# Patient Record
Sex: Female | Born: 1950
Health system: Southern US, Community
[De-identification: ages and names within clinical notes are randomized; demographics above are authoritative.]

## PROBLEM LIST (undated history)

## (undated) DIAGNOSIS — Z974 Presence of external hearing-aid: Secondary | ICD-10-CM

## (undated) DIAGNOSIS — M1990 Unspecified osteoarthritis, unspecified site: Secondary | ICD-10-CM

## (undated) HISTORY — PX: CATARACT EXTRACTION W/ INTRAOCULAR LENS IMPLANT: SHX1309

## (undated) HISTORY — PX: EYE SURGERY: SHX253

## (undated) HISTORY — PX: COLONOSCOPY: SHX174

---

## 2003-09-18 DIAGNOSIS — Q100 Congenital ptosis: Secondary | ICD-10-CM | POA: Insufficient documentation

## 2006-09-06 ENCOUNTER — Emergency Department: Payer: Self-pay | Admitting: Emergency Medicine

## 2011-04-29 ENCOUNTER — Ambulatory Visit: Payer: Self-pay

## 2013-04-02 DIAGNOSIS — Z9889 Other specified postprocedural states: Secondary | ICD-10-CM | POA: Insufficient documentation

## 2013-04-02 DIAGNOSIS — Z124 Encounter for screening for malignant neoplasm of cervix: Secondary | ICD-10-CM | POA: Insufficient documentation

## 2014-03-19 HISTORY — PX: KNEE ARTHROSCOPY: SUR90

## 2015-10-10 DIAGNOSIS — D1721 Benign lipomatous neoplasm of skin and subcutaneous tissue of right arm: Secondary | ICD-10-CM | POA: Insufficient documentation

## 2015-10-10 DIAGNOSIS — M19071 Primary osteoarthritis, right ankle and foot: Secondary | ICD-10-CM | POA: Insufficient documentation

## 2015-12-05 DIAGNOSIS — L219 Seborrheic dermatitis, unspecified: Secondary | ICD-10-CM | POA: Insufficient documentation

## 2015-12-05 DIAGNOSIS — B0089 Other herpesviral infection: Secondary | ICD-10-CM | POA: Insufficient documentation

## 2015-12-05 DIAGNOSIS — B009 Herpesviral infection, unspecified: Secondary | ICD-10-CM | POA: Insufficient documentation

## 2016-04-23 DIAGNOSIS — H903 Sensorineural hearing loss, bilateral: Secondary | ICD-10-CM | POA: Diagnosis not present

## 2016-05-16 DIAGNOSIS — Z1231 Encounter for screening mammogram for malignant neoplasm of breast: Secondary | ICD-10-CM | POA: Diagnosis not present

## 2016-06-07 DIAGNOSIS — Z87891 Personal history of nicotine dependence: Secondary | ICD-10-CM | POA: Diagnosis not present

## 2016-06-07 DIAGNOSIS — S61451A Open bite of right hand, initial encounter: Secondary | ICD-10-CM | POA: Diagnosis not present

## 2016-06-07 DIAGNOSIS — M19041 Primary osteoarthritis, right hand: Secondary | ICD-10-CM | POA: Diagnosis not present

## 2016-06-07 DIAGNOSIS — S61210A Laceration without foreign body of right index finger without damage to nail, initial encounter: Secondary | ICD-10-CM | POA: Diagnosis not present

## 2016-06-07 DIAGNOSIS — Z6833 Body mass index (BMI) 33.0-33.9, adult: Secondary | ICD-10-CM | POA: Diagnosis not present

## 2016-06-07 DIAGNOSIS — S61259A Open bite of unspecified finger without damage to nail, initial encounter: Secondary | ICD-10-CM | POA: Diagnosis not present

## 2016-06-07 DIAGNOSIS — S61250A Open bite of right index finger without damage to nail, initial encounter: Secondary | ICD-10-CM | POA: Diagnosis not present

## 2016-07-09 DIAGNOSIS — M25552 Pain in left hip: Secondary | ICD-10-CM | POA: Diagnosis not present

## 2016-07-09 DIAGNOSIS — S86111A Strain of other muscle(s) and tendon(s) of posterior muscle group at lower leg level, right leg, initial encounter: Secondary | ICD-10-CM | POA: Diagnosis not present

## 2016-07-13 DIAGNOSIS — Z7982 Long term (current) use of aspirin: Secondary | ICD-10-CM | POA: Diagnosis not present

## 2016-07-13 DIAGNOSIS — Z87891 Personal history of nicotine dependence: Secondary | ICD-10-CM | POA: Diagnosis not present

## 2016-07-13 DIAGNOSIS — Z1211 Encounter for screening for malignant neoplasm of colon: Secondary | ICD-10-CM | POA: Diagnosis not present

## 2016-07-13 DIAGNOSIS — D126 Benign neoplasm of colon, unspecified: Secondary | ICD-10-CM | POA: Diagnosis not present

## 2016-07-13 DIAGNOSIS — K5730 Diverticulosis of large intestine without perforation or abscess without bleeding: Secondary | ICD-10-CM | POA: Diagnosis not present

## 2016-07-13 DIAGNOSIS — E785 Hyperlipidemia, unspecified: Secondary | ICD-10-CM | POA: Diagnosis not present

## 2016-07-13 DIAGNOSIS — D122 Benign neoplasm of ascending colon: Secondary | ICD-10-CM | POA: Diagnosis not present

## 2016-09-03 DIAGNOSIS — H2512 Age-related nuclear cataract, left eye: Secondary | ICD-10-CM | POA: Diagnosis not present

## 2017-02-28 DIAGNOSIS — L309 Dermatitis, unspecified: Secondary | ICD-10-CM | POA: Diagnosis not present

## 2017-02-28 DIAGNOSIS — Z6833 Body mass index (BMI) 33.0-33.9, adult: Secondary | ICD-10-CM | POA: Diagnosis not present

## 2017-02-28 DIAGNOSIS — Z0000 Encounter for general adult medical examination without abnormal findings: Secondary | ICD-10-CM | POA: Diagnosis not present

## 2017-02-28 DIAGNOSIS — Z23 Encounter for immunization: Secondary | ICD-10-CM | POA: Diagnosis not present

## 2017-05-20 DIAGNOSIS — Z1231 Encounter for screening mammogram for malignant neoplasm of breast: Secondary | ICD-10-CM | POA: Diagnosis not present

## 2017-10-01 ENCOUNTER — Encounter: Payer: Self-pay | Admitting: Podiatry

## 2017-10-01 ENCOUNTER — Ambulatory Visit (INDEPENDENT_AMBULATORY_CARE_PROVIDER_SITE_OTHER): Payer: Medicare Other | Admitting: Podiatry

## 2017-10-01 ENCOUNTER — Ambulatory Visit (INDEPENDENT_AMBULATORY_CARE_PROVIDER_SITE_OTHER): Payer: Medicare Other

## 2017-10-01 VITALS — BP 144/79 | HR 54 | Resp 16

## 2017-10-01 DIAGNOSIS — M76821 Posterior tibial tendinitis, right leg: Secondary | ICD-10-CM

## 2017-10-01 DIAGNOSIS — M722 Plantar fascial fibromatosis: Secondary | ICD-10-CM

## 2017-10-01 NOTE — Patient Instructions (Signed)

## 2017-10-02 NOTE — Progress Notes (Signed)
  Subjective:  Patient ID: Heather Rodriguez, female    DOB: 07-02-1950,  MRN: [id number] HPI Chief Complaint  Patient presents with  . Ankle Pain    Medial and lateral ankle right - aching x 1 year, swelling, [place] ortho said had tendonitis over 1 year ago, been trying CBD oil and different shoes (some help)   . Foot Pain    Plantar heel left - aching x several years off and on, AM pain  . New Patient (Initial Visit)    67 y.o. female presents with the above complaint.   ROS: Denies fever chills nausea vomiting muscle aches pains calf pain back pain chest pain shortness of breath.  No past medical history on file.  No current outpatient medications on file.  No Known Allergies Review of Systems Objective:   Vitals:   10/01/17 1544  BP: (!) 144/79  Pulse: (!) 54  Resp: 16    General: Well developed, nourished, in no acute distress, alert and oriented x3   Dermatological: Skin is warm, dry and supple bilateral. Nails x 10 are well maintained; remaining integument appears unremarkable at this time. There are no open sores, no preulcerative lesions, no rash or signs of infection present.  Vascular: Dorsalis Pedis artery and Posterior Tibial artery pedal pulses are 2/4 bilateral with immedate capillary fill time. Pedal hair growth present. No varicosities and no lower extremity edema present bilateral.   Neruologic: Grossly intact via light touch bilateral. Vibratory intact via tuning fork bilateral. Protective threshold with Semmes Wienstein monofilament intact to all pedal sites bilateral. Patellar and Achilles deep tendon reflexes 2+ bilateral. No Babinski or clonus noted bilateral.   Musculoskeletal: No gross boney pedal deformities bilateral. No pain, crepitus, or limitation noted with foot and ankle range of motion bilateral. Muscular strength 5/5 in all groups tested bilateral.  She still has tenderness on palpation of the posterior tibial tendon insertion site of the navicular as  it courses beneath the medial malleolus.  There is some area of fluctuance consistent with synovitis or fluid within the tendon sheath itself.  The tendon is palpable and does not appear to be transected.  She also has some tenderness on palpation medial calcaneal tubercle of the left heel but not significant.  Gait: Unassisted, Nonantalgic.    Radiographs:  Radiographs taken today demonstrate moderate severe pes planus right no significant acute findings.  Soft tissue swelling around the ankle is noted.  Ankle joint space actually looks very good left foot demonstrates some soft tissue swelling of the plantar fascia at the calcaneal insertion site left.  Assessment & Plan:   Assessment: Pes planus with posterior tibial tendinitis right.  Cannot rule out a tear.  Plantar fasciitis left.  Plan: Discussed etiology pathology conservative or surgical therapies at this point injected to the area with 20 mg Kenalog 5 mg Marcaine just beneath and anterior to the medial malleolus right ankle.  Placed her in a Tri-Lock brace and tennis shoes and will follow-up with her in 1 month to reevaluate.  MRI may be necessary at that point to rule out any type of tear.     Rainna Nearhood T. [place], [place]

## 2017-10-29 ENCOUNTER — Ambulatory Visit: Payer: Medicare Other | Admitting: Podiatry

## 2017-10-31 ENCOUNTER — Encounter: Payer: Self-pay | Admitting: Podiatry

## 2017-10-31 ENCOUNTER — Ambulatory Visit (INDEPENDENT_AMBULATORY_CARE_PROVIDER_SITE_OTHER): Payer: Medicare Other | Admitting: Podiatry

## 2017-10-31 DIAGNOSIS — M76821 Posterior tibial tendinitis, right leg: Secondary | ICD-10-CM | POA: Diagnosis not present

## 2017-10-31 NOTE — Progress Notes (Signed)
She presents today for follow-up of her posterior tibial tendinitis right and plantar fasciitis left.  The left foot is doing great the right one is still giving me if it is hurting so bad I can hardly stand on it.  Objective: Vital signs are stable she is alert and oriented x3.  Pulses are palpable.  She still has considerable pain on palpation of the posterior tibial tendon with pain on inversion against resistance.  Upon palpation of the tendon there is an area of fluctuance just beneath the medial malleolus.  Assessment: Cannot rule out a tear of the posterior tibial tendon.  And plantar fasciitis.  Plan: At this point am requesting an MRI of her posterior tibial tendon tear for surgical evaluation.

## 2017-11-01 ENCOUNTER — Other Ambulatory Visit: Payer: Self-pay

## 2017-11-01 DIAGNOSIS — M76821 Posterior tibial tendinitis, right leg: Secondary | ICD-10-CM

## 2017-11-01 DIAGNOSIS — M722 Plantar fascial fibromatosis: Secondary | ICD-10-CM

## 2017-11-01 DIAGNOSIS — S96911A Strain of unspecified muscle and tendon at ankle and foot level, right foot, initial encounter: Secondary | ICD-10-CM

## 2017-12-24 DIAGNOSIS — Z23 Encounter for immunization: Secondary | ICD-10-CM | POA: Diagnosis not present

## 2018-01-06 DIAGNOSIS — L308 Other specified dermatitis: Secondary | ICD-10-CM | POA: Diagnosis not present

## 2018-03-31 ENCOUNTER — Telehealth: Payer: Self-pay | Admitting: Podiatry

## 2018-03-31 NOTE — Telephone Encounter (Signed)
I think I need I need to have the MRI done now.

## 2018-03-31 NOTE — Telephone Encounter (Signed)
Pt states she saw Dr. Milinda Pointer and received a cortisone shot, and the pain improved for about 6 months, now it is as it was prior to the injection. Pt states she felt good and cancelled the MRI appt and was told that the orders would last a year. Orders to Gretta Arab, RN for pre-cert and faxed to [facility].

## 2018-04-08 ENCOUNTER — Ambulatory Visit
Admission: RE | Admit: 2018-04-08 | Discharge: 2018-04-08 | Disposition: A | Discharge status: Home / Self Care | Payer: Medicare Other | Source: Ambulatory Visit | Attending: Podiatry | Admitting: Podiatry

## 2018-04-08 DIAGNOSIS — M76821 Posterior tibial tendinitis, right leg: Secondary | ICD-10-CM

## 2018-04-08 DIAGNOSIS — S96911A Strain of unspecified muscle and tendon at ankle and foot level, right foot, initial encounter: Secondary | ICD-10-CM

## 2018-04-08 DIAGNOSIS — M722 Plantar fascial fibromatosis: Secondary | ICD-10-CM

## 2018-04-10 ENCOUNTER — Telehealth: Payer: Self-pay | Admitting: *Deleted

## 2018-04-10 NOTE — Telephone Encounter (Signed)
-----   Message from Garrel Ridgel, [place] sent at 04/09/2018 12:10 PM EST ----- Please send for over read and inform patient of the delay.

## 2018-04-10 NOTE — Telephone Encounter (Signed)
I informed pt of Dr. Stephenie Acres request to send a copy of the MRI disc to a radiology specialist for more details for treatment planning, there would be a 10-14 day delay in the results and we would call with instructions. Pt states understanding.

## 2018-04-10 NOTE — Telephone Encounter (Signed)
Faxed request for copy of MRI disc from [facility].

## 2018-04-11 NOTE — Telephone Encounter (Signed)
Received copy of MRI disc and mailed to [facility]. 

## 2018-04-21 ENCOUNTER — Telehealth: Payer: Self-pay | Admitting: Podiatry

## 2018-04-21 ENCOUNTER — Encounter: Payer: Self-pay | Admitting: Podiatry

## 2018-04-21 NOTE — Telephone Encounter (Signed)
Pt had MRI done a couple of weeks ago and is calling to see if we have the results. Please give pt a call.

## 2018-04-21 NOTE — Telephone Encounter (Signed)
Left message informing pt the final results of her MRI were available and to make an appt to discuss with Dr. Milinda Pointer.

## 2018-04-22 ENCOUNTER — Telehealth: Payer: Self-pay | Admitting: *Deleted

## 2018-04-22 NOTE — Telephone Encounter (Signed)
-----   Message from Garrel Ridgel, [place] sent at 04/22/2018  7:09 AM EST ----- Have her in

## 2018-04-29 ENCOUNTER — Ambulatory Visit (INDEPENDENT_AMBULATORY_CARE_PROVIDER_SITE_OTHER): Payer: Medicare Other | Admitting: Podiatry

## 2018-04-29 ENCOUNTER — Telehealth: Payer: Self-pay | Admitting: *Deleted

## 2018-04-29 ENCOUNTER — Encounter: Payer: Self-pay | Admitting: Podiatry

## 2018-04-29 DIAGNOSIS — M722 Plantar fascial fibromatosis: Secondary | ICD-10-CM

## 2018-04-29 DIAGNOSIS — M76821 Posterior tibial tendinitis, right leg: Secondary | ICD-10-CM

## 2018-04-29 DIAGNOSIS — S96911A Strain of unspecified muscle and tendon at ankle and foot level, right foot, initial encounter: Secondary | ICD-10-CM

## 2018-04-29 NOTE — Telephone Encounter (Signed)
Faxed required form, and demographics to [place].

## 2018-04-29 NOTE — Progress Notes (Signed)
She presents today for follow-up of her MRI report.  Objective: Vital signs are stable she is alert and oriented x3.  She still has pain and fluctuance on palpation to the posterior tibial tendon.  We go over both MRI reports today the primary MRI report and the over read.  She states that she did not understand any that when she was reading it.  I explained explained to her today that basically she was looking at a small tear of the posterior tibial tendon.  She also has some Achilles tendinitis.  Assessment: Achilles tendinitis primarily a posterior tibial tendon tear.  Plan: Discussed etiology pathology and surgical therapies at this point I feel that is necessary to go ahead and get her into physical therapy strengthening this posterior tibial tendon if possible prior to surgery but also hoping that this will take care of all of her symptoms.  If it does not we had discussed the need for surgical intervention.  She is amenable to it not would like to go ahead and have it done

## 2018-04-29 NOTE — Telephone Encounter (Signed)
-----   Message from Rip Harbour, [facility] sent at 04/29/2018 11:44 AM EST ----- Regarding: PT [facility] - Main office  Evaluate and treat - posterior tibial tendonitis and achilles tendonitis left   3 x week x 4 weeks

## 2018-05-01 ENCOUNTER — Telehealth: Payer: Self-pay | Admitting: Podiatry

## 2018-05-01 DIAGNOSIS — S96911A Strain of unspecified muscle and tendon at ankle and foot level, right foot, initial encounter: Secondary | ICD-10-CM

## 2018-05-01 DIAGNOSIS — M76821 Posterior tibial tendinitis, right leg: Secondary | ICD-10-CM

## 2018-05-01 NOTE — Telephone Encounter (Signed)
I have a question regarding my appointment I had with Dr. Milinda Pointer on Tuesday. Also, I want to change the referral for PT from [facility] to [place].

## 2018-05-02 NOTE — Telephone Encounter (Signed)
Left message informing pt I was returning her call and had begun the process of referring to [place] [address], [phone]. Faxed orders to [facility].

## 2018-05-02 NOTE — Addendum Note (Signed)
Addended by: Harriett Sine D on: 05/02/2018 09:24 AM   Modules accepted: Orders

## 2019-06-16 ENCOUNTER — Other Ambulatory Visit: Payer: Self-pay

## 2019-06-16 ENCOUNTER — Encounter: Payer: Self-pay | Admitting: Ophthalmology

## 2019-06-18 NOTE — Discharge Instructions (Signed)

## 2019-06-22 ENCOUNTER — Other Ambulatory Visit: Payer: Self-pay

## 2019-06-22 ENCOUNTER — Other Ambulatory Visit
Admission: RE | Admit: 2019-06-22 | Discharge: 2019-06-22 | Disposition: A | Discharge status: Home / Self Care | Payer: Medicare Other | Source: Ambulatory Visit | Attending: Ophthalmology | Admitting: Ophthalmology

## 2019-06-22 DIAGNOSIS — Z01812 Encounter for preprocedural laboratory examination: Secondary | ICD-10-CM | POA: Diagnosis present

## 2019-06-22 DIAGNOSIS — Z20822 Contact with and (suspected) exposure to covid-19: Secondary | ICD-10-CM | POA: Insufficient documentation

## 2019-06-23 LAB — SARS CORONAVIRUS 2 (TAT 6-24 HRS): SARS Coronavirus 2: NEGATIVE

## 2019-06-24 ENCOUNTER — Other Ambulatory Visit: Payer: Self-pay

## 2019-06-24 ENCOUNTER — Encounter: Payer: Self-pay | Admitting: Ophthalmology

## 2019-06-24 ENCOUNTER — Ambulatory Visit: Payer: Medicare Other | Admitting: Anesthesiology

## 2019-06-24 ENCOUNTER — Ambulatory Visit
Admission: RE | Admit: 2019-06-24 | Discharge: 2019-06-24 | Disposition: A | Discharge status: Home / Self Care | Payer: Medicare Other | Attending: Ophthalmology | Admitting: Ophthalmology

## 2019-06-24 ENCOUNTER — Encounter
Admission: RE | Disposition: A | Discharge status: Home / Self Care | Payer: Self-pay | Source: Home / Self Care | Attending: Ophthalmology

## 2019-06-24 DIAGNOSIS — H2512 Age-related nuclear cataract, left eye: Secondary | ICD-10-CM | POA: Diagnosis not present

## 2019-06-24 DIAGNOSIS — Z87891 Personal history of nicotine dependence: Secondary | ICD-10-CM | POA: Diagnosis not present

## 2019-06-24 DIAGNOSIS — E669 Obesity, unspecified: Secondary | ICD-10-CM | POA: Insufficient documentation

## 2019-06-24 DIAGNOSIS — Z6833 Body mass index (BMI) 33.0-33.9, adult: Secondary | ICD-10-CM | POA: Insufficient documentation

## 2019-06-24 DIAGNOSIS — M1990 Unspecified osteoarthritis, unspecified site: Secondary | ICD-10-CM | POA: Diagnosis not present

## 2019-06-24 HISTORY — DX: Unspecified osteoarthritis, unspecified site: M19.90

## 2019-06-24 HISTORY — DX: Presence of external hearing-aid: Z97.4

## 2019-06-24 HISTORY — PX: CATARACT EXTRACTION W/PHACO: SHX586

## 2019-06-24 SURGERY — PHACOEMULSIFICATION, CATARACT, WITH IOL INSERTION
Anesthesia: Monitor Anesthesia Care | Site: Eye | Laterality: Left

## 2019-06-24 MED ORDER — ARMC OPHTHALMIC DILATING DROPS
1.0000 "application " | OPHTHALMIC | Status: DC | PRN
  Administered 2019-06-24 (×3): 1 via OPHTHALMIC

## 2019-06-24 MED ORDER — EPINEPHRINE PF 1 MG/ML IJ SOLN
INTRAOCULAR | Status: DC | PRN
  Administered 2019-06-24: 82 mL via OPHTHALMIC

## 2019-06-24 MED ORDER — FENTANYL CITRATE (PF) 100 MCG/2ML IJ SOLN
INTRAMUSCULAR | Status: DC | PRN
  Administered 2019-06-24: 100 ug via INTRAVENOUS

## 2019-06-24 MED ORDER — LIDOCAINE HCL (PF) 2 % IJ SOLN
INTRAOCULAR | Status: DC | PRN
  Administered 2019-06-24: 1 mL

## 2019-06-24 MED ORDER — TETRACAINE HCL 0.5 % OP SOLN
1.0000 [drp] | OPHTHALMIC | Status: DC | PRN
  Administered 2019-06-24 (×3): 1 [drp] via OPHTHALMIC

## 2019-06-24 MED ORDER — NA HYALUR & NA CHOND-NA HYALUR 0.4-0.35 ML IO KIT
PACK | INTRAOCULAR | Status: DC | PRN
  Administered 2019-06-24: 1 mL via INTRAOCULAR

## 2019-06-24 MED ORDER — BRIMONIDINE TARTRATE-TIMOLOL 0.2-0.5 % OP SOLN
OPHTHALMIC | Status: DC | PRN
  Administered 2019-06-24: 1 [drp] via OPHTHALMIC

## 2019-06-24 MED ORDER — MIDAZOLAM HCL 2 MG/2ML IJ SOLN
INTRAMUSCULAR | Status: DC | PRN
  Administered 2019-06-24: 2 mg via INTRAVENOUS

## 2019-06-24 MED ORDER — MOXIFLOXACIN HCL 0.5 % OP SOLN
1.0000 [drp] | OPHTHALMIC | Status: DC | PRN
  Administered 2019-06-24 (×3): 1 [drp] via OPHTHALMIC

## 2019-06-24 MED ORDER — CEFUROXIME OPHTHALMIC INJECTION 1 MG/0.1 ML
INJECTION | OPHTHALMIC | Status: DC | PRN
  Administered 2019-06-24: 0.1 mL via INTRACAMERAL

## 2019-06-24 SURGICAL SUPPLY — 24 items
CANNULA ANT/CHMB 27G (MISCELLANEOUS) ×1 IMPLANT
CANNULA ANT/CHMB 27GA (MISCELLANEOUS) ×3 IMPLANT
GLOVE SURG LX 7.5 STRW (GLOVE) ×2
GLOVE SURG LX STRL 7.5 STRW (GLOVE) ×1 IMPLANT
GLOVE SURG TRIUMPH 8.0 PF LTX (GLOVE) ×3 IMPLANT
GOWN STRL REUS W/ TWL LRG LVL3 (GOWN DISPOSABLE) ×2 IMPLANT
GOWN STRL REUS W/TWL LRG LVL3 (GOWN DISPOSABLE) ×4
LENS IOL ACRSF IQ TRC 6 16.5 IMPLANT
LENS IOL ACRYSOF IQ TORIC 16.5 ×2 IMPLANT
LENS IOL IQ TORIC 6 16.5 ×1 IMPLANT
MARKER SKIN DUAL TIP RULER LAB (MISCELLANEOUS) ×3 IMPLANT
NDL CAPSULORHEX 25GA (NEEDLE) ×1 IMPLANT
NDL FILTER BLUNT 18X1 1/2 (NEEDLE) ×2 IMPLANT
NEEDLE CAPSULORHEX 25GA (NEEDLE) ×3 IMPLANT
NEEDLE FILTER BLUNT 18X 1/2SAF (NEEDLE) ×4
NEEDLE FILTER BLUNT 18X1 1/2 (NEEDLE) ×2 IMPLANT
PACK CATARACT BRASINGTON (MISCELLANEOUS) ×3 IMPLANT
PACK EYE AFTER SURG (MISCELLANEOUS) ×3 IMPLANT
PACK OPTHALMIC (MISCELLANEOUS) ×3 IMPLANT
SOLUTION OPHTHALMIC SALT (MISCELLANEOUS) ×3 IMPLANT
SYR 3ML LL SCALE MARK (SYRINGE) ×6 IMPLANT
SYR TB 1ML LUER SLIP (SYRINGE) ×3 IMPLANT
WATER STERILE IRR 250ML POUR (IV SOLUTION) ×3 IMPLANT
WIPE NON LINTING 3.25X3.25 (MISCELLANEOUS) ×3 IMPLANT

## 2019-06-24 NOTE — Anesthesia Preprocedure Evaluation (Signed)
Anesthesia Evaluation  Patient identified by MRN, date of birth, ID band Patient awake    History of Anesthesia Complications Negative for: history of anesthetic complications  Airway Mallampati: I  TM Distance: >3 FB Neck ROM: Full    Dental no notable dental hx.    Pulmonary former smoker,    Pulmonary exam normal        Cardiovascular Exercise Tolerance: Good negative cardio ROS Normal cardiovascular exam     Neuro/Psych negative neurological ROS     GI/Hepatic negative GI ROS, Neg liver ROS,   Endo/Other  Obesity BMI 33  Renal/GU negative Renal ROS     Musculoskeletal  (+) Arthritis ,   Abdominal   Peds  Hematology negative hematology ROS (+)   Anesthesia Other Findings   Reproductive/Obstetrics                            Anesthesia Physical Anesthesia Plan  ASA: II  Anesthesia Plan: MAC   Post-op Pain Management:    Induction: Inhalational  PONV Risk Score and Plan: 2 and Midazolam, TIVA and Treatment may vary due to age or medical condition  Airway Management Planned: Nasal Cannula and Natural Airway  Additional Equipment: None  Intra-op Plan:   Post-operative Plan:   Informed Consent: I have reviewed the patients History and Physical, chart, labs and discussed the procedure including the risks, benefits and alternatives for the proposed anesthesia with the patient or authorized representative who has indicated his/her understanding and acceptance.       Plan Discussed with: CRNA  Anesthesia Plan Comments:         Anesthesia Quick Evaluation

## 2019-06-24 NOTE — Anesthesia Procedure Notes (Signed)
Procedure Name: MAC Performed by: Izetta Dakin, CRNA Pre-anesthesia Checklist: Timeout performed, Patient being monitored, Suction available, Emergency Drugs available and Patient identified Patient Re-evaluated:Patient Re-evaluated prior to induction Oxygen Delivery Method: Nasal cannula

## 2019-06-24 NOTE — Op Note (Signed)
LOCATION:  [place]   PREOPERATIVE DIAGNOSIS:  Nuclear sclerotic cataract of the left eye.  H25.12  POSTOPERATIVE DIAGNOSIS:  Nuclear sclerotic cataract of the left eye.   PROCEDURE:  Phacoemulsification with Toric posterior chamber intraocular lens placement of the left eye.  Ultrasound time: Procedure(s): CATARACT EXTRACTION PHACO AND INTRAOCULAR LENS PLACEMENT (IOC) LEFT TORIC LENS 3.78  00:48.4  7.8% (Left) LENS:[id number] 16.5 DToric intraocular lens with 3.75 diopters of cylindrical power with axis orientation at 86 degrees.    SURGEON:  Wyonia Hough, MD   ANESTHESIA:  Topical with tetracaine drops and 2% Xylocaine jelly, augmented with 1% preservative-free intracameral lidocaine.  COMPLICATIONS:  None.   DESCRIPTION OF PROCEDURE:  The patient was identified in the holding room and transported to the operating suite and placed in the supine position under the operating microscope.  The left eye was identified as the operative eye, and it was prepped and draped in the usual sterile ophthalmic fashion.    A clear-corneal paracentesis incision was made at the 1:30 position.  0.5 ml of preservative-free 1% lidocaine was injected into the anterior chamber. The anterior chamber was filled with Viscoat.  A 2.4 millimeter near clear corneal incision was then made at the 10:30 position.  A cystotome and capsulorrhexis forceps were then used to make a curvilinear capsulorrhexis.  Hydrodissection and hydrodelineation were then performed using balanced salt solution.   Phacoemulsification was then used in stop and chop fashion to remove the lens, nucleus and epinucleus.  The remaining cortex was aspirated using the irrigation and aspiration handpiece.  Provisc viscoelastic was then placed into the capsular bag to distend it for lens placement.  The Verion digital marker was used to align the implant at the intended axis.   A 16.5 diopter lens was then injected into the capsular  bag.  It was rotated clockwise until the axis marks on the lens were approximately 15 degrees in the counterclockwise direction to the intended alignment.  The viscoelastic was aspirated from the eye using the irrigation aspiration handpiece.  Then, a Koch spatula through the sideport incision was used to rotate the lens in a clockwise direction until the axis markings of the intraocular lens were lined up with the Verion alignment.  Balanced salt solution was then used to hydrate the wounds. Cefuroxime 0.1 ml of a 10mg /ml solution was injected into the anterior chamber for a dose of 1 mg of intracameral antibiotic at the completion of the case.    The eye was noted to have a physiologic pressure and there was no wound leak noted.   Timolol and Brimonidine drops were applied to the eye.  The patient was taken to the recovery room in stable condition having had no complications of anesthesia or surgery.  Thurmond Hildebran 06/24/2019, 11:30 AM

## 2019-06-24 NOTE — Transfer of Care (Signed)
Immediate Anesthesia Transfer of Care Note  Patient: Heather Rodriguez  Procedure(s) Performed: CATARACT EXTRACTION PHACO AND INTRAOCULAR LENS PLACEMENT (IOC) LEFT TORIC LENS 3.78  00:48.4  7.8% (Left Eye)  Patient Location: PACU  Anesthesia Type: MAC  Level of Consciousness: awake, alert  and patient cooperative  Airway and Oxygen Therapy: Patient Spontanous Breathing and Patient connected to supplemental oxygen  Post-op Assessment: Post-op Vital signs reviewed, Patient's Cardiovascular Status Stable, Respiratory Function Stable, Patent Airway and No signs of Nausea or vomiting  Post-op Vital Signs: Reviewed and stable  Complications: No apparent anesthesia complications

## 2019-06-24 NOTE — H&P (Signed)

## 2019-06-25 ENCOUNTER — Encounter: Payer: Self-pay | Admitting: *Deleted

## 2019-07-06 NOTE — Anesthesia Postprocedure Evaluation (Signed)
Anesthesia Post Note  Patient: Heather Rodriguez  Procedure(s) Performed: CATARACT EXTRACTION PHACO AND INTRAOCULAR LENS PLACEMENT (IOC) LEFT TORIC LENS 3.78  00:48.4  7.8% (Left Eye)     Patient location during evaluation: PACU Anesthesia Type: MAC Level of consciousness: awake and alert Pain management: pain level controlled Vital Signs Assessment: post-procedure vital signs reviewed and stable Respiratory status: spontaneous breathing Cardiovascular status: blood pressure returned to baseline Postop Assessment: no apparent nausea or vomiting, adequate PO intake and no headache Anesthetic complications: no    Adele Barthel Nisha Dhami

## 2019-08-18 IMAGING — MR MR ANKLE*R* W/O CM
5 series · 40 of 40 positions shown · non-contrast
Comparison: None.

CLINICAL DATA: Right ankle pain worse laterally with painful range
of motion. Symptoms have been ongoing for 2 years. Patient states
having rolled her ankle more than once in the past. Cortisone
injection [DATE] relieved her symptoms for 6 months.

EXAM:
MRI OF THE RIGHT ANKLE WITHOUT CONTRAST
TECHNIQUE: Multiplanar, multisequence MR imaging of the ankle was performed. No
intravenous contrast was administered.

[Series 4: T2 fat-sat · axial · 3.0mm · 0.53mm/px · z∈[-23,+117]mm · 8 of 36 slices shown (1 of 2)]
[im 1/36]
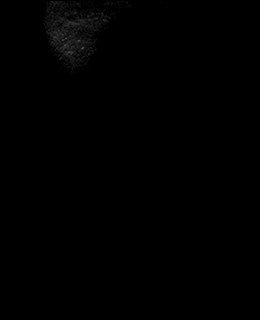
[im 6/36]
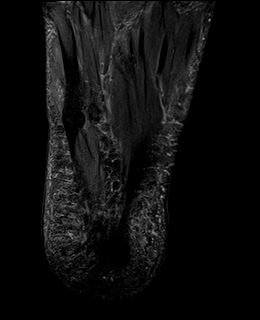
[im 11/36]
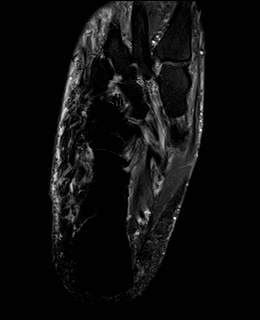
[im 16/36]
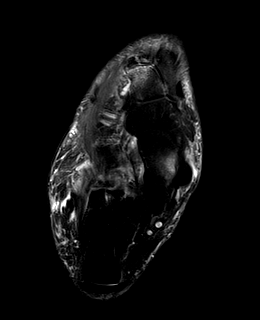
[im 21/36]
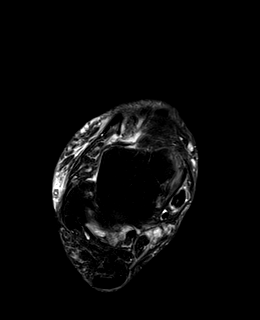
[im 26/36]
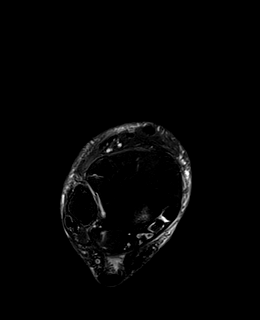
[im 31/36]
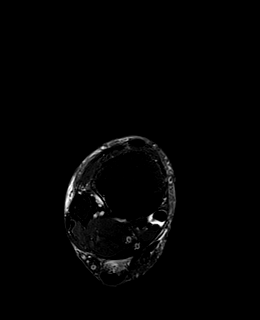
[im 36/36]
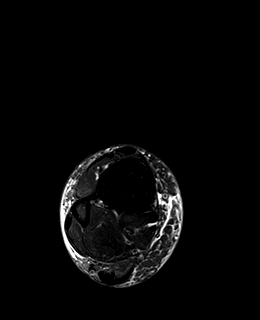

[Series 5: PD fat-sat · axial · 3.0mm · 0.47mm/px · z∈[-23,+116]mm · 9 of 36 slices shown]
[im 1/36]
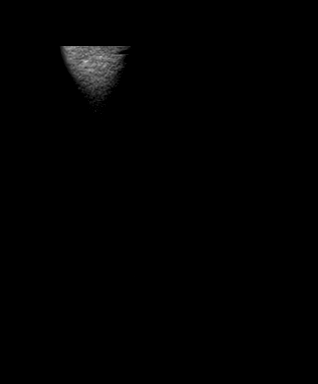
[im 5/36]
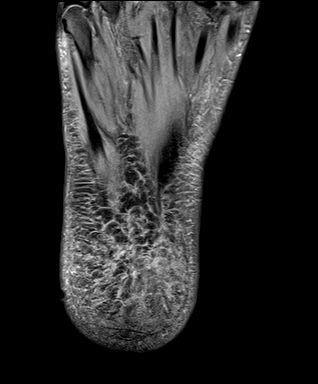
[im 9/36]
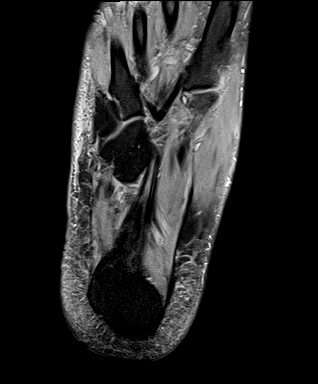
[im 14/36]
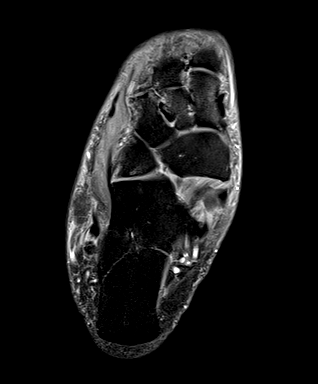
[im 18/36]
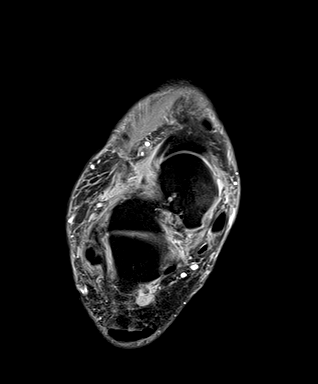
[im 22/36]
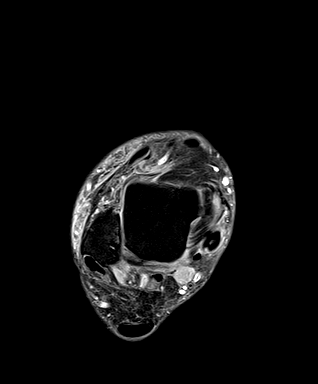
[im 27/36]
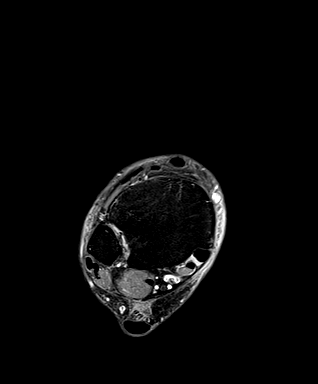
[im 31/36]
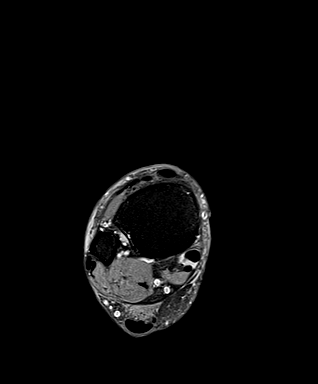
[im 36/36]
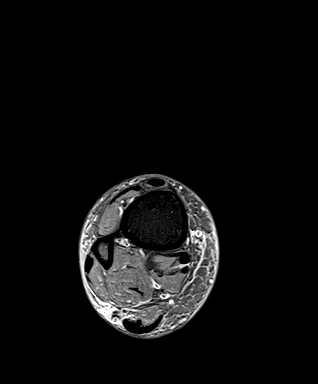

[Series 6: T2 fat-sat · coronal · 3.0mm · 0.62mm/px · 11 of 43 slices shown (2 of 2)]
[im 1/43]
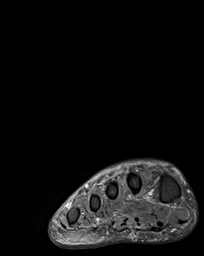
[im 5/43]
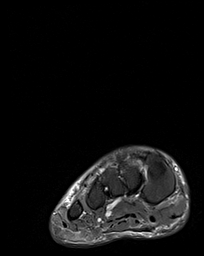
[im 9/43]
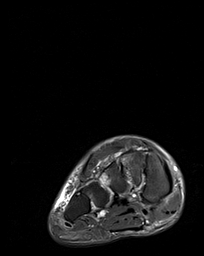
[im 13/43]
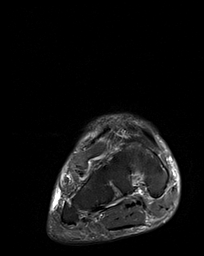
[im 17/43]
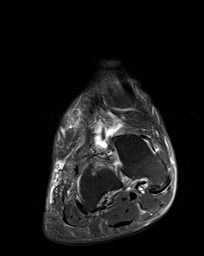
[im 22/43]
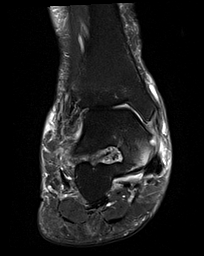
[im 26/43]
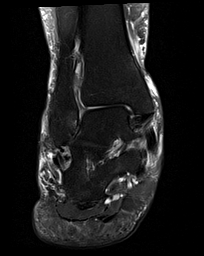
[im 30/43]
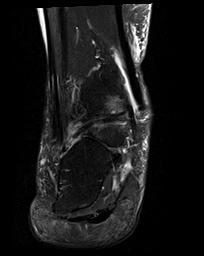
[im 34/43]
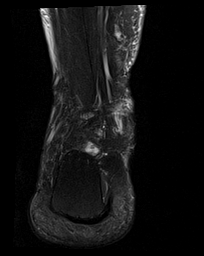
[im 38/43]
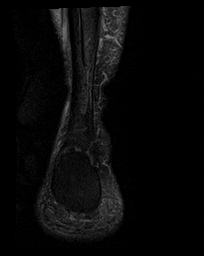
[im 43/43]
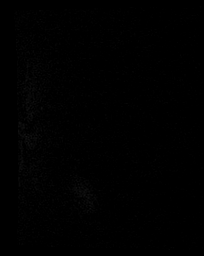

[Series 7: T1 · sagittal · 4.0mm · 0.56mm/px · 6 of 24 slices shown]
[im 1/24]
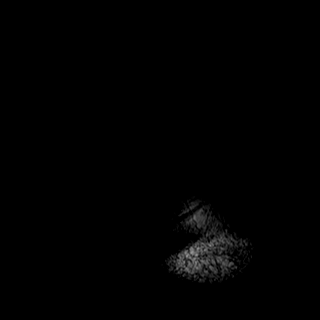
[im 5/24]
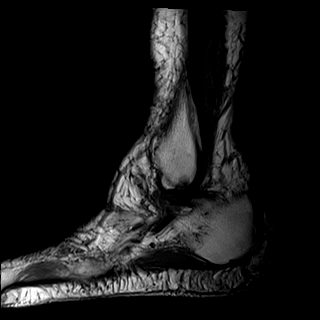
[im 10/24]
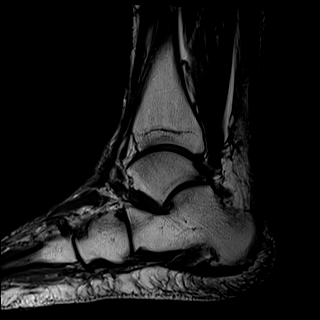
[im 14/24]
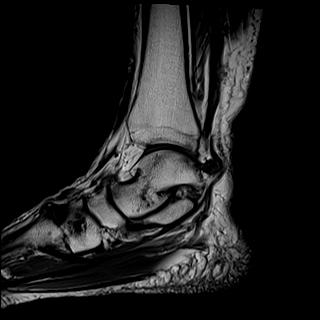
[im 19/24]
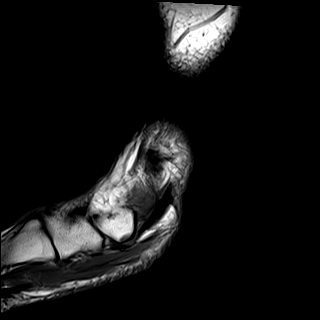
[im 24/24]
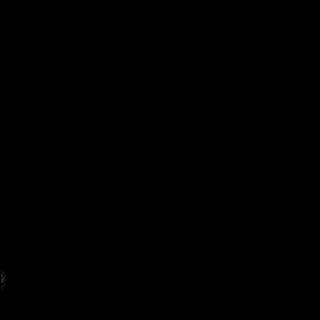

[Series 8: STIR · sagittal · 4.0mm · 0.70mm/px · 6 of 24 slices shown]
[im 1/24]
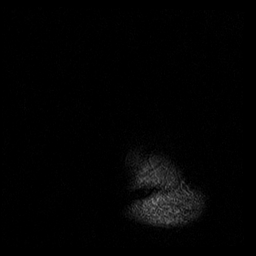
[im 5/24]
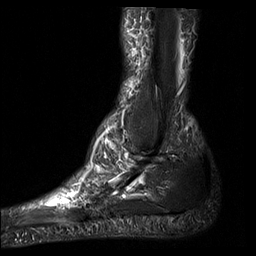
[im 10/24]
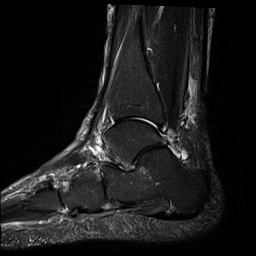
[im 14/24]
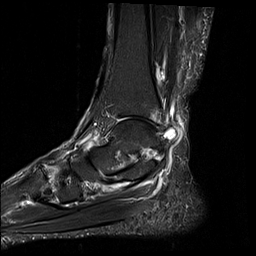
[im 19/24]
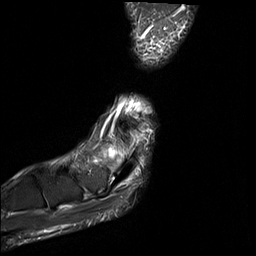
[im 24/24]
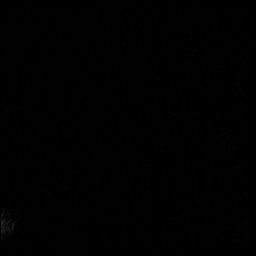

[40 of 40 positions shown; findings below may reference images not displayed]

FINDINGS: TENDONS

Peroneal: Intact without tenosynovitis or tear.

Posteromedial: Intrasubstance tear of the posterior tibial tendon,
series images 15-17. Intact flexor hallucis longus and flexor
digitorum longus tendons.

Anterior: Intact

Achilles: Normal signal intensity morphology.

Plantar Fascia: Intact

LIGAMENTS

Lateral: Intact

Medial: Intact

CARTILAGE

Ankle Joint: No joint effusion

Subtalar Joints/Sinus Tarsi: Intact with degenerative change noted.

Bones:

Distal tibia and fibula: Mild subchondral edema of the tibial
plafond posteriorly, series [DATE] likely related to full-thickness
fissuring of the cartilage. Mild reactive marrow edema along the
medial aspect of the distal fibula, series [DATE].

Talus: Subtle marrow edema along the talar neck with faint
hypointense linear lucencies raise the possibility of a talar stress
fracture, series [DATE] and series [DATE].

Calcaneus: No fracture or marrow signal abnormalities. Plantar
calcaneal enthesopathy is noted.

Midfoot: Osteoarthritis across the second TMT joint with subchondral
cystic change, series [DATE]. Similar findings are cross the
calcaneocuboid articulation and across the subtalar joint. No acute
fracture, joint dislocation or other marrow signal abnormalities.

Other: Subcutaneous soft tissue edema of the included leg, about the
malleoli, hind and midfoot. Suspect small ganglia along the
posteromedial aspect of the ankle joint, largest measuring 9 mm. No
focal soft tissue fluid collection or abscess.
IMPRESSION: 1. Intrasubstance tear of the posterior tibial tendon as it crosses
the ankle joint.
2. Subtle marrow edema of the talar neck along its medial aspect
raising the possibility of a subtle talar stress fracture.
3. Osteoarthritis across the ankle and subtalar joints as well as at
the second TMT.

## 2020-04-20 ENCOUNTER — Encounter: Payer: Self-pay | Admitting: Ophthalmology

## 2020-05-04 ENCOUNTER — Other Ambulatory Visit
Admission: RE | Admit: 2020-05-04 | Discharge: 2020-05-04 | Disposition: A | Discharge status: Home / Self Care | Payer: Medicare Other | Source: Ambulatory Visit | Attending: Ophthalmology | Admitting: Ophthalmology

## 2020-05-04 ENCOUNTER — Other Ambulatory Visit: Payer: Self-pay

## 2020-05-04 DIAGNOSIS — Z01812 Encounter for preprocedural laboratory examination: Secondary | ICD-10-CM | POA: Insufficient documentation

## 2020-05-04 DIAGNOSIS — Z20822 Contact with and (suspected) exposure to covid-19: Secondary | ICD-10-CM | POA: Insufficient documentation

## 2020-05-04 NOTE — Discharge Instructions (Signed)
INSTRUCTIONS FOLLOWING OCULOPLASTIC SURGERY AMY M. FOWLER, MD  AFTER YOUR EYE SURGERY, THER ARE MANY THINGS WHICH YOU, THE PATIENT, CAN DO TO ASSURE THE BEST POSSIBLE RESULT FROM YOUR OPERATION.  THIS SHEET SHOULD BE REFERRED TO WHENEVER QUESTIONS ARISE.  IF THERE ARE ANY QUESTIONS NOT ANSWERED HERE, DO NOT HESITATE TO CALL OUR OFFICE AT [phone] OR [phone].  THERE IS ALWAYS SOMEONE AVAILABLE TO CALL IF QUESTIONS OR PROBLEMS ARISE.  VISION: Your vision may be blurred and out of focus after surgery until you are able to stop using your ointment, swelling resolves and your eye(s) heal. This may take 1 to 2 weeks at the least.  If your vision becomes gradually more dim or dark, this is not normal and you need to call our office immediately.  EYE CARE: For the first 48 hours after surgery, use ice packs frequently - "20 minutes on, 20 minutes off" - to help reduce swelling and bruising.  Small bags of frozen peas or corn make good ice packs along with cloths soaked in ice water.  If you are wearing a patch or other type of dressing following surgery, keep this on for the amount of time specified by your doctor.  For the first week following surgery, you will need to treat your stitches with great care.  It is OK to shower, but take care to not allow soapy water to run into your eye(s) to help reduce chances of infection.  You may gently clean the eyelashes and around the eye(s) with cotton balls and sterile water, BUT DO NOT RUB THE STITCHES VIGOROUSLY.  Keeping your stitches moist with ointment will help promote healing with minimal scar formation.  ACTIVITY: When you leave the surgery center, you should go home, rest and be inactive.  The eye(s) may feel scratchy and keeping the eyes closed will allow for faster healing.  The first week following surgery, avoid straining (anything making the face turn red) or lifting over 20 pounds.  Additionally, avoid bending which causes your head to go below  your waist.  Using your eyes will NOT harm them, so feel free to read, watch television, use the computer, etc as desired.  Driving depends on each individual, so check with your doctor if you have questions about driving. Do not wear contact lenses for about 2 weeks.  Do not wear eye makeup for 2 weeks.  Avoid swimming, hot tubs, gardening, and dusting for 1 to 2 weeks to reduce the risk of an infection.  MEDICATIONS:  You will be given a prescription for an ointment to use 4 times a day on your stitches.  You can use the ointment in your eyes if they feel scratchy or irritated.  If you eyelid(s) don't close completely when you sleep, put some ointment in your eyes before bedtime.  EMERGENCY: If you experience SEVERE EYE PAIN OR HEADACHE UNRELIEVED BY TYLENOL OR TRAMADOL, NAUSEA OR VOMITING, WORSENING REDNESS, OR WORSENING VISION (ESPECIALLY VISION THAT WAS INITIALLY BETTER) CALL [phone] OR [phone] DURING BUSINESS HOURS OR AFTER HOURS. General Anesthesia, Adult, Care After This sheet gives you information about how to care for yourself after your procedure. Your health care provider may also give you more specific instructions. If you have problems or questions, contact your health care provider. What can I expect after the procedure? After the procedure, the following side effects are common:  Pain or discomfort at the IV site.  Nausea.  Vomiting.  Sore throat.  Trouble concentrating.  Feeling   cold or chills.  Feeling weak or tired.  Sleepiness and fatigue.  Soreness and body aches. These side effects can affect parts of the body that were not involved in surgery. Follow these instructions at home: For the time period you were told by your health care provider:  Rest.  Do not participate in activities where you could fall or become injured.  Do not drive or use machinery.  Do not drink alcohol.  Do not take sleeping pills or medicines that cause drowsiness.  Do  not make important decisions or sign legal documents.  Do not take care of children on your own.   Eating and drinking  Follow any instructions from your health care provider about eating or drinking restrictions.  When you feel hungry, start by eating small amounts of foods that are soft and easy to digest (bland), such as toast. Gradually return to your regular diet.  Drink enough fluid to keep your urine pale yellow.  If you vomit, rehydrate by drinking water, juice, or clear broth. General instructions  If you have sleep apnea, surgery and certain medicines can increase your risk for breathing problems. Follow instructions from your health care provider about wearing your sleep device: ? Anytime you are sleeping, including during daytime naps. ? While taking prescription pain medicines, sleeping medicines, or medicines that make you drowsy.  Have a responsible adult stay with you for the time you are told. It is important to have someone help care for you until you are awake and alert.  Return to your normal activities as told by your health care provider. Ask your health care provider what activities are safe for you.  Take over-the-counter and prescription medicines only as told by your health care provider.  If you smoke, do not smoke without supervision.  Keep all follow-up visits as told by your health care provider. This is important. Contact a health care provider if:  You have nausea or vomiting that does not get better with medicine.  You cannot eat or drink without vomiting.  You have pain that does not get better with medicine.  You are unable to pass urine.  You develop a skin rash.  You have a fever.  You have redness around your IV site that gets worse. Get help right away if:  You have difficulty breathing.  You have chest pain.  You have blood in your urine or stool, or you vomit blood. Summary  After the procedure, it is common to have a sore  throat or nausea. It is also common to feel tired.  Have a responsible adult stay with you for the time you are told. It is important to have someone help care for you until you are awake and alert.  When you feel hungry, start by eating small amounts of foods that are soft and easy to digest (bland), such as toast. Gradually return to your regular diet.  Drink enough fluid to keep your urine pale yellow.  Return to your normal activities as told by your health care provider. Ask your health care provider what activities are safe for you. This information is not intended to replace advice given to you by your health care provider. Make sure you discuss any questions you have with your health care provider. Document Revised: 11/19/2019 Document Reviewed: 06/18/2019 Elsevier Patient Education  2021 [employer]  

## 2020-05-05 LAB — SARS CORONAVIRUS 2 (TAT 6-24 HRS): SARS Coronavirus 2: NEGATIVE

## 2020-05-06 ENCOUNTER — Encounter
Admission: RE | Disposition: A | Discharge status: Home / Self Care | Payer: Self-pay | Source: Home / Self Care | Attending: Ophthalmology

## 2020-05-06 ENCOUNTER — Ambulatory Visit
Admission: RE | Admit: 2020-05-06 | Discharge: 2020-05-06 | Disposition: A | Discharge status: Home / Self Care | Payer: Medicare Other | Attending: Ophthalmology | Admitting: Ophthalmology

## 2020-05-06 ENCOUNTER — Other Ambulatory Visit: Payer: Self-pay

## 2020-05-06 ENCOUNTER — Encounter: Payer: Self-pay | Admitting: Ophthalmology

## 2020-05-06 ENCOUNTER — Ambulatory Visit: Payer: Medicare Other | Admitting: Anesthesiology

## 2020-05-06 DIAGNOSIS — H02833 Dermatochalasis of right eye, unspecified eyelid: Secondary | ICD-10-CM | POA: Insufficient documentation

## 2020-05-06 DIAGNOSIS — H02403 Unspecified ptosis of bilateral eyelids: Secondary | ICD-10-CM | POA: Diagnosis present

## 2020-05-06 DIAGNOSIS — Z87891 Personal history of nicotine dependence: Secondary | ICD-10-CM | POA: Insufficient documentation

## 2020-05-06 HISTORY — PX: BROW LIFT: SHX178

## 2020-05-06 SURGERY — BLEPHAROPLASTY
Anesthesia: General | Site: Eye | Laterality: Bilateral

## 2020-05-06 MED ORDER — ACETAMINOPHEN 160 MG/5ML PO SOLN: 325.0000 mg | ORAL | Status: DC | PRN

## 2020-05-06 MED ORDER — TETRACAINE HCL 0.5 % OP SOLN
OPHTHALMIC | Status: DC | PRN
  Administered 2020-05-06: 1 [drp] via OPHTHALMIC

## 2020-05-06 MED ORDER — LIDOCAINE-EPINEPHRINE 2 %-1:100000 IJ SOLN
INTRAMUSCULAR | Status: DC | PRN
  Administered 2020-05-06: .5 mL via OPHTHALMIC
  Administered 2020-05-06: 1 mL via OPHTHALMIC

## 2020-05-06 MED ORDER — ALFENTANIL 500 MCG/ML IJ INJ
INJECTION | INTRAVENOUS | Status: DC | PRN
  Administered 2020-05-06: 500 ug via INTRAVENOUS
  Administered 2020-05-06: 300 ug via INTRAVENOUS
  Administered 2020-05-06: 200 ug via INTRAVENOUS

## 2020-05-06 MED ORDER — PROPOFOL 500 MG/50ML IV EMUL
INTRAVENOUS | Status: DC | PRN
  Administered 2020-05-06: 25 ug/kg/min via INTRAVENOUS

## 2020-05-06 MED ORDER — MIDAZOLAM HCL 2 MG/2ML IJ SOLN
INTRAMUSCULAR | Status: DC | PRN
  Administered 2020-05-06 (×2): 1 mg via INTRAVENOUS

## 2020-05-06 MED ORDER — LACTATED RINGERS IV SOLN: INTRAVENOUS | Status: DC

## 2020-05-06 MED ORDER — TRAMADOL HCL 50 MG PO TABS: ORAL_TABLET | ORAL | 0 refills | Status: AC

## 2020-05-06 MED ORDER — ERYTHROMYCIN 5 MG/GM OP OINT
TOPICAL_OINTMENT | OPHTHALMIC | Status: DC | PRN
  Administered 2020-05-06: 1 via OPHTHALMIC

## 2020-05-06 MED ORDER — ONDANSETRON HCL 4 MG/2ML IJ SOLN: 4.0000 mg | Freq: Once | INTRAMUSCULAR | Status: DC | PRN

## 2020-05-06 MED ORDER — BSS IO SOLN
INTRAOCULAR | Status: DC | PRN
  Administered 2020-05-06: 15 mL via INTRAOCULAR

## 2020-05-06 MED ORDER — ERYTHROMYCIN 5 MG/GM OP OINT: TOPICAL_OINTMENT | OPHTHALMIC | 2 refills | Status: AC

## 2020-05-06 MED ORDER — ACETAMINOPHEN 325 MG PO TABS
325.0000 mg | ORAL_TABLET | ORAL | Status: DC | PRN
  Administered 2020-05-06: 650 mg via ORAL

## 2020-05-06 SURGICAL SUPPLY — 23 items
APPLICATOR COTTON TIP WD 3 STR (MISCELLANEOUS) ×2 IMPLANT
BLADE SURG 15 STRL LF DISP TIS (BLADE) ×1 IMPLANT
BLADE SURG 15 STRL SS (BLADE) ×2
CORD BIP STRL DISP 12FT (MISCELLANEOUS) ×2 IMPLANT
DRAPE HEAD BAR (DRAPES) ×2 IMPLANT
GAUZE SPONGE 4X4 12PLY STRL (GAUZE/BANDAGES/DRESSINGS) ×2 IMPLANT
GLOVE SURG LX 7.0 MICRO (GLOVE) ×2
GLOVE SURG LX STRL 7.0 MICRO (GLOVE) ×2 IMPLANT
GOWN STRL REUS W/ TWL LRG LVL3 (GOWN DISPOSABLE) ×1 IMPLANT
GOWN STRL REUS W/TWL LRG LVL3 (GOWN DISPOSABLE) ×2
MARKER SKIN XFINE TIP W/RULER (MISCELLANEOUS) ×2 IMPLANT
NEEDLE FILTER BLUNT 18X 1/2SAF (NEEDLE) ×1
NEEDLE FILTER BLUNT 18X1 1/2 (NEEDLE) ×1 IMPLANT
NEEDLE HYPO 30X.5 LL (NEEDLE) ×4 IMPLANT
PACK ENT CUSTOM (PACKS) ×2 IMPLANT
SOL PREP PVP 2OZ (MISCELLANEOUS) ×2
SOLUTION PREP PVP 2OZ (MISCELLANEOUS) ×1 IMPLANT
SPONGE GAUZE 2X2 8PLY STRL LF (GAUZE/BANDAGES/DRESSINGS) ×20 IMPLANT
SUT GUT PLAIN 6-0 1X18 ABS (SUTURE) ×2 IMPLANT
SUT PROLENE 6 0 P 1 18 (SUTURE) ×4 IMPLANT
SYR 10ML LL (SYRINGE) ×2 IMPLANT
SYR 3ML LL SCALE MARK (SYRINGE) ×2 IMPLANT
WATER STERILE IRR 250ML POUR (IV SOLUTION) ×2 IMPLANT

## 2020-05-06 NOTE — Anesthesia Postprocedure Evaluation (Signed)
Anesthesia Post Note  Patient: Heather Rodriguez  Procedure(s) Performed: BLEPHAROPLASTY UPPER EYELID; W/EXCESS SKIN BLEPHAROPTOSIS REPAIR; RESECT EX BILATERAL (Bilateral Eye)     Patient location during evaluation: PACU Anesthesia Type: General Level of consciousness: awake Pain management: pain level controlled Vital Signs Assessment: post-procedure vital signs reviewed and stable Respiratory status: respiratory function stable Cardiovascular status: stable Postop Assessment: no signs of nausea or vomiting Anesthetic complications: no   No complications documented.  Veda Canning

## 2020-05-06 NOTE — Op Note (Signed)
Preoperative Diagnosis:  1. Visually significant blepharoptosis bilateral  Upper Eyelid(s) 2. Visually significant dermatochalasis bilateral  Upper Eyelid(s)  Postoperative Diagnosis:  Same.  Procedure(s) Performed:   1. Blepharoptosis repair with levator aponeurosis advancement bilateral  Upper Eyelid(s) 2. Upper eyelid blepharoplasty with excess skin excision  bilateral  Upper Eyelid(s)  Surgeon: Philis Pique. Vickki Muff, M.D.  Assistants: none  Anesthesia: MAC  Specimens: None.  Estimated Blood Loss: Minimal.  Complications: None.  Operative Findings: None Dictated  Procedure:   Allergies were reviewed and the patient Patient has no known allergies..   After the risks, benefits, complications and alternatives were discussed with the patient, appropriate informed consent was obtained.  While seated in an upright position and looking in primary gaze, the mid pupillary line was marked on the upper eyelid margins bilaterally. The patient was then brought to the operating suite and reclined supine.  Timeout was conducted and the patient was sedated.  Local anesthetic consisting of a 50-50 mixture of 2% lidocaine with epinephrine and 0.75% bupivacaine with added Hylenex was injected subcutaneously to both  upper eyelid(s). After adequate local was instilled, the patient was prepped and draped in the usual sterile fashion for eyelid surgery.   Attention was turned to the upper eyelids. A 15m upper eyelid crease incision line was marked with calipers on both  upper eyelid(s).  A pinch test was used to estimate the amount of excess skin to remove and this was marked in standard blepharoplasty style fashion. Attention was turned to the  right  upper eyelid. A #15 blade was used to open the premarked incision line. A Skin and muscle flap was excised and hemostasis was obtained with bipolar cautery.   A buttonhole was created in the orbital septum to reveal the fat pockets. These were dissected free  from fascial attachments, cauterized towards the pedicle base and excised to produce a nice flattening of the upper eyelid.  A strip of ROOF fat was excised to debulk the lateral upper eyelid.  Westcott scissors were then used to transect through orbicularis down to the tarsal plate. Epitarsus was dissected to create a smooth surface to suture to. Dissection was then carried superiorly in the plane between orbicularis and orbital septum. Once the preaponeurotic fat pocket was identified, the orbital septum was opened. This revealed the levator and its aponeurosis.    Attention was then turned to the opposite eyelid where the same procedure was performed in the same manner. Hemostasis was obtained with bipolar cautery throughout.   3 interrupted 6-0 Prolene sutures were then passed partial thickness through the tarsal plates of both  upper eyelid(s). These sutures were placed in line with the mid pupillary, medial limbal, and lateral limbal lines. The sutures were fixed to the levator aponeurosis and adjusted until a nice lid height and contour were achieved. Once nice symmetry was achieved, the skin incisions were closed with a running 6-0 plain gut suture. The patient tolerated the procedure well.  Erythromycin ophthalmic ophthalmic ointment was applied to the incision site(s) followed by ice packs. The patient was taken to the recovery area where she recovered without difficulty.  Post-Op Plan/Instructions:   The patient was instructed to use ice packs frequently for the next 48 hours. She was instructed to use Erythromycin ophthalmic ophthalmic ointment on her incisions 4 times a day for the next 12 to 14 days. Shewas given a prescription for tramadol (or similar) for pain control should Tylenol not be effective. She was asked to to follow  up at the [facility] in [place], [place] in 2-3 weeks' time or sooner as needed for problems.  Amy M. Vickki Muff, M.D. Ophthalmology

## 2020-05-06 NOTE — Anesthesia Procedure Notes (Signed)
Date/Time: 05/06/2020 1:36 PM Performed by: Cameron Ali, CRNA Pre-anesthesia Checklist: Patient identified, Emergency Drugs available, Suction available, Timeout performed and Patient being monitored Patient Re-evaluated:Patient Re-evaluated prior to induction Oxygen Delivery Method: Nasal cannula Placement Confirmation: positive ETCO2

## 2020-05-06 NOTE — Interval H&P Note (Signed)
History and Physical Interval Note:  05/06/2020 1:24 PM  Heather Rodriguez  has presented today for surgery, with the diagnosis of H02.831 Dermatochalasis of Right Upper Eyelid H02.834 Dermatochalasis of Left Upper Eyelid H02.403 Ptosis of Eyelid, Unspecified, Bilateral.  The various methods of treatment have been discussed with the patient and family. After consideration of risks, benefits and other options for treatment, the patient has consented to  Procedure(s): BLEPHAROPLASTY UPPER EYELID; W/EXCESS SKIN BLEPHAROPTOSIS REPAIR; RESECT EX BILATERAL (Bilateral) as a surgical intervention.  The patient's history has been reviewed, patient examined, no change in status, stable for surgery.  I have reviewed the patient's chart and labs.  Questions were answered to the patient's satisfaction.     Vickki Muff, Lavon Bothwell M

## 2020-05-06 NOTE — Anesthesia Preprocedure Evaluation (Signed)
Anesthesia Evaluation  Patient identified by MRN, date of birth, ID band Patient awake    Reviewed: Allergy & Precautions, NPO status , Patient's Chart, lab work & pertinent test results  Airway Mallampati: II  TM Distance: >3 FB     Dental   Pulmonary neg recent URI, former smoker,    breath sounds clear to auscultation       Cardiovascular negative cardio ROS   Rhythm:Regular Rate:Normal     Neuro/Psych  Headaches,    GI/Hepatic negative GI ROS, neg GERD  ,  Endo/Other  Hypothyroidism Obesity - BMI 33  Renal/GU      Musculoskeletal  (+) Arthritis ,   Abdominal   Peds  Hematology   Anesthesia Other Findings   Reproductive/Obstetrics                             Anesthesia Physical Anesthesia Plan  ASA: II  Anesthesia Plan: General   Post-op Pain Management:    Induction: Intravenous  PONV Risk Score and Plan: Propofol infusion, TIVA and Treatment may vary due to age or medical condition  Airway Management Planned: Natural Airway and Nasal Cannula  Additional Equipment:   Intra-op Plan:   Post-operative Plan:   Informed Consent: I have reviewed the patients History and Physical, chart, labs and discussed the procedure including the risks, benefits and alternatives for the proposed anesthesia with the patient or authorized representative who has indicated his/her understanding and acceptance.       Plan Discussed with: CRNA  Anesthesia Plan Comments:         Anesthesia Quick Evaluation

## 2020-05-06 NOTE — H&P (Signed)
See the history and physical completed at [facility] on 04/19/2020 and scanned into the chart.

## 2020-05-06 NOTE — Transfer of Care (Signed)
Immediate Anesthesia Transfer of Care Note  Patient: Heather Rodriguez  Procedure(s) Performed: BLEPHAROPLASTY UPPER EYELID; W/EXCESS SKIN BLEPHAROPTOSIS REPAIR; RESECT EX BILATERAL (Bilateral Eye)  Patient Location: PACU  Anesthesia Type: General  Level of Consciousness: awake, alert  and patient cooperative  Airway and Oxygen Therapy: Patient Spontanous Breathing and Patient connected to supplemental oxygen  Post-op Assessment: Post-op Vital signs reviewed, Patient's Cardiovascular Status Stable, Respiratory Function Stable, Patent Airway and No signs of Nausea or vomiting  Post-op Vital Signs: Reviewed and stable  Complications: No complications documented.

## 2020-05-08 NOTE — Addendum Note (Signed)
Addendum  created 05/08/20 1105 by Veda Canning, MD   Child order released for a procedure order, Intraprocedure Blocks edited, Order Canceled from Note

## 2020-05-09 ENCOUNTER — Encounter: Payer: Self-pay | Admitting: Ophthalmology
